# Patient Record
Sex: Male | Born: 1990 | Race: Black or African American | Hispanic: No | Marital: Married | Smoking: Never smoker
Health system: Southern US, Community
[De-identification: ages and names within clinical notes are randomized; demographics above are authoritative.]

---
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
## 2016-11-22 ENCOUNTER — Emergency Department (HOSPITAL_COMMUNITY)
Admission: EM | Admit: 2016-11-22 | Discharge: 2016-11-22 | Disposition: A | Discharge status: Home / Self Care | Payer: Self-pay | Attending: Emergency Medicine | Admitting: Emergency Medicine

## 2016-11-22 ENCOUNTER — Encounter (HOSPITAL_COMMUNITY): Payer: Self-pay | Admitting: Emergency Medicine

## 2016-11-22 DIAGNOSIS — R110 Nausea: Secondary | ICD-10-CM | POA: Insufficient documentation

## 2016-11-22 DIAGNOSIS — R6883 Chills (without fever): Secondary | ICD-10-CM | POA: Insufficient documentation

## 2016-11-22 DIAGNOSIS — R197 Diarrhea, unspecified: Secondary | ICD-10-CM | POA: Insufficient documentation

## 2016-11-22 MED ORDER — ACETAMINOPHEN 500 MG PO TABS
1000.0000 mg | ORAL_TABLET | Freq: Once | ORAL | Status: AC
  Administered 2016-11-22: 1000 mg via ORAL
  Filled 2016-11-22: qty 2

## 2016-11-22 NOTE — ED Notes (Signed)
Patient given crackers and water

## 2016-11-22 NOTE — ED Triage Notes (Signed)
Pt c/o chills and nausea since last night. Pt denies vomiting. States he has been able to take minimal po fluids. Denies pain.

## 2016-11-22 NOTE — Discharge Instructions (Signed)
Please call the number listed in the black box to obtain a primary care doctor.  If your symptoms do not improve, or you develop any new concerning symptoms please seek further evaluation.    Please stay hydrated.

## 2016-11-22 NOTE — ED Provider Notes (Signed)
[facility]-EMERGENCY DEPT Provider Note   CSN: [id number] Arrival date & time: 11/22/16  1347  By signing my name below, I, Phillips Climes, attest that this documentation has been prepared under the direction and in the presence of Cristina Gong, [place]. Electronically Signed: Phillips Climes, Scribe. 11/22/2016. 8:03 PM.  History   Chief Complaint Chief Complaint  Patient presents with  . Chills  . Nausea   HPI Comments Jay Harris is a 26 y.o. male with no reported PMHx, who presents to the Emergency Department with complaints of subjective fever and chills x14 hours.  Associated nausea and diarrhea.  No back or abdominal pain.  No sick contact.  No recent ABX use.  Normal diet.  No recent international travel.  No dyspnea.  Possible hx of seasonal allergies, for which he takes no OTC medication.  No myalgias, headache, eye irritation, ear pain, sore throat, rashes, neck pain or neck stiffness.  Pt treated his sx with Advil this morning.  He reports low PO intake, 2/2 discomfort.  No recent tick bites or rashes.  The history is provided by the patient. No language interpreter was used.    History reviewed. No pertinent past medical history.  There are no active problems to display for this patient.   History reviewed. No pertinent surgical history.     Home Medications    Prior to Admission medications   Not on File    Family History History reviewed. No pertinent family history.  Social History Social History  Substance Use Topics  . Smoking status: Never Smoker  . Smokeless tobacco: Never Used  . Alcohol use No     Allergies   Patient has no allergy information on record.   Review of Systems Review of Systems  Constitutional: Positive for chills and fever.  HENT: Negative for ear discharge, ear pain and sore throat.   Eyes: Negative for pain, discharge, redness and itching.  Respiratory: Negative for shortness of breath.   Gastrointestinal: Positive  for abdominal pain, diarrhea and nausea. Negative for vomiting.  Musculoskeletal: Negative for back pain, myalgias, neck pain and neck stiffness.  Skin: Negative for rash.  Neurological: Negative for headaches.     Physical Exam Updated Vital Signs BP 124/69   Pulse 79   Temp 98 F (36.7 C)   Resp 16   SpO2 99%   Physical Exam  Constitutional: He appears well-developed and well-nourished. No distress.  HENT:  Head: Normocephalic and atraumatic.  Nasal mucosa was edematous.  Neck: Normal range of motion. Neck supple.  Pulmonary/Chest: Effort normal. No respiratory distress.  Abdominal: Soft. Bowel sounds are normal. He exhibits no distension and no mass. There is no tenderness. There is no rebound and no guarding.  Musculoskeletal: Normal range of motion. He exhibits no edema or tenderness.  Neurological: He is alert.  Skin: Skin is warm and dry. No rash noted.  Nursing note and vitals reviewed.  ED Treatments / Results  DIAGNOSTIC STUDIES: Oxygen Saturation is 100% on room air, normal by my interpretation.    COORDINATION OF CARE: 3:32 PM Discussed treatment plan with pt at bedside and pt agreed to plan.  5:15 PM Pt passed PO challenge. He would like to be d/c home.  Strict return precautions were given and He Stated his understanding.   Labs (all labs ordered are listed, but only abnormal results are displayed) Labs Reviewed - No data to display  EKG  EKG Interpretation None  Radiology No results found.  Procedures Procedures (including critical care time)  Medications Ordered in ED Medications  acetaminophen (TYLENOL) tablet 1,000 mg (1,000 mg Oral Given 11/22/16 1655)     Initial Impression / Assessment and Plan / ED Course  I have reviewed the triage vital signs and the nursing notes.  Pertinent labs & imaging results that were available during my care of the patient were reviewed by me and considered in my medical decision making (see chart for  details).    Patient with symptoms consistent with viral gastroenteritis.  Vitals are stable, no fever.  No signs of dehydration, tolerating PO fluids > 6 oz.  Lungs are clear.  No focal abdominal pain, no concern for appendicitis, cholecystitis, pancreatitis, ruptured viscus, UTI, kidney stone, or any other abdominal etiology.  Supportive therapy indicated with return if symptoms worsen.  No history of tick bites, patient reports he does not spend much time outside.  Patient counseled on expected course and given return precautions, of which he stated his understanding.  Patient given information about how to obtain a primary care provider. Patient was given the option to ask questions, all of which were answered to the best of my ability.  I personally performed the services described in this documentation, which was scribed in my presence. The recorded information has been reviewed and is accurate.    Final Clinical Impressions(s) / ED Diagnoses   Final diagnoses:  Chills  Nausea  Diarrhea, unspecified type    New Prescriptions There are no discharge medications for this patient.    Norman ClayHammond, Elizabeth W, PA-C 11/22/16 2004    Tegeler, Canary Brimhristopher J, MD 11/22/16 2011

## 2020-02-12 ENCOUNTER — Other Ambulatory Visit: Payer: Self-pay

## 2020-02-12 ENCOUNTER — Encounter (HOSPITAL_COMMUNITY): Payer: Self-pay

## 2020-02-12 ENCOUNTER — Emergency Department (HOSPITAL_COMMUNITY): Payer: No Typology Code available for payment source

## 2020-02-12 ENCOUNTER — Emergency Department (HOSPITAL_COMMUNITY)
Admission: EM | Admit: 2020-02-12 | Discharge: 2020-02-12 | Disposition: A | Discharge status: Home / Self Care | Payer: No Typology Code available for payment source | Attending: Emergency Medicine | Admitting: Emergency Medicine

## 2020-02-12 DIAGNOSIS — Y999 Unspecified external cause status: Secondary | ICD-10-CM | POA: Insufficient documentation

## 2020-02-12 DIAGNOSIS — Y92410 Unspecified street and highway as the place of occurrence of the external cause: Secondary | ICD-10-CM | POA: Insufficient documentation

## 2020-02-12 DIAGNOSIS — Y939 Activity, unspecified: Secondary | ICD-10-CM | POA: Diagnosis not present

## 2020-02-12 DIAGNOSIS — F1590 Other stimulant use, unspecified, uncomplicated: Secondary | ICD-10-CM | POA: Insufficient documentation

## 2020-02-12 DIAGNOSIS — T1490XA Injury, unspecified, initial encounter: Secondary | ICD-10-CM

## 2020-02-12 DIAGNOSIS — M79602 Pain in left arm: Secondary | ICD-10-CM | POA: Insufficient documentation

## 2020-02-12 MED ORDER — KETOROLAC TROMETHAMINE 60 MG/2ML IM SOLN
60.0000 mg | Freq: Once | INTRAMUSCULAR | Status: AC
  Administered 2020-02-12: 60 mg via INTRAMUSCULAR
  Filled 2020-02-12: qty 2
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
## 2020-02-12 NOTE — Discharge Instructions (Signed)
Your work-up today was overall reassuring.  Please make sure to take anti-inflammatory such as ibuprofen or Tylenol for pain.  Return to the ER if your symptoms worsen.  I also have provided a referral to [facility] health community health and wellness which is a free clinic in the area.  Please follow-up with them if your symptoms do not improve.

## 2021-09-22 DIAGNOSIS — F32A Depression, unspecified: Secondary | ICD-10-CM | POA: Insufficient documentation

## 2021-09-22 DIAGNOSIS — Z560 Unemployment, unspecified: Secondary | ICD-10-CM | POA: Insufficient documentation

## 2021-09-23 ENCOUNTER — Ambulatory Visit (HOSPITAL_COMMUNITY)
Admission: EM | Admit: 2021-09-23 | Discharge: 2021-09-23 | Disposition: A | Discharge status: Home / Self Care | Payer: No Payment, Other | Attending: Psychiatry | Admitting: Psychiatry

## 2021-09-23 DIAGNOSIS — R454 Irritability and anger: Secondary | ICD-10-CM

## 2021-09-23 DIAGNOSIS — F4389 Other reactions to severe stress: Secondary | ICD-10-CM

## 2021-09-23 NOTE — ED Provider Notes (Signed)
Behavioral Health Urgent Care Medical Screening Exam ? ?Patient Name: Jay Harris ?MRN: [id number] ?Date of Evaluation: 09/23/21 ?Chief Complaint:   ?Diagnosis:  ?Final diagnoses:  ?None  ? ? ?History of Present illness: Jay Harris is a 31 y.o. male. Present to [facility] accompany by his wife,  c/o of depression and anger.  According to the patient his has been dealing with anger since age 79 yrs old and it has become increasingly frequent lately.  He get angry at every and anything.  Pt lives with wife  and has two pet dogs in the home.  Pt is currently unemployed.   ? ?Pt wife stated she just want him to get help because,  tonight the dog did a minor thing and the patient got real angry for nothing. Wife reported that patient sometime would throw stuff or punch the wall whenever he get angry.    ? ?Observation of patient,  he is alert and oriented x 4,  speech is clear but low tone.  Maintain minimal eye contact.  Affect is flat, and depressed,  mood congruent with affect.  Pt denies current SI, but stated he thought about suicide a month ago "I felt if I wasn't hear I couldn't hurt anyone".  Pt denies AVH, but report paranoia,  according to patient he feels like people are talking about him whenever he go out,  and of such he stay home mody of the time.  Pt report he is home 24/7, Pt denies childhood abuse or trauma,  denies drinking.  According to patient he does smoke marijuana every other day,  but denies using any other illicit drugs. ?Pt denies taking any prescription medication.  He does report he was seeing a therapy  at [facility] when he was around 50-18 yrs old. There is no evidence that patient is influence by internal or external stimuli. ? ?Recommend: follow up with Psychiatry walk-in clinic in the AM,  out patient therapy ? ?Psychiatric Specialty Exam ? ?Presentation  ?General Appearance:Appropriate for Environment ? ?Eye Contact:Fair ? ?Speech:Clear and Coherent ? ?Speech  Volume:Decreased ? ?Handedness:Ambidextrous ? ? ?Mood and Affect  ?Mood:Anxious; Depressed; Labile; Hopeless ? ?Affect:Flat; Constricted; Depressed ? ? ?Thought Process  ?Thought Processes:Coherent ? ?Descriptions of Associations:Circumstantial ? ?Orientation:Full (Time, Place and Person) ? ?Thought Content:Abstract Reasoning ?   Hallucinations:None ? ?Ideas of Reference:None ? ?Suicidal Thoughts:Yes, Passive ?Without Intent ? ?Homicidal Thoughts:No ? ? ?Sensorium  ?Memory:Immediate Good ? ?Judgment:Fair ? ?Insight:Fair ? ? ?Executive Functions  ?Concentration:Good ? ?Attention Span:Good ? ?Recall:Good ? ?Fund of Knowledge:Good ? ?Language:Good ? ? ?Psychomotor Activity  ?Psychomotor Activity:No data recorded ? ?Assets  ?Assets:No data recorded ? ?Sleep  ?Sleep:Fair ? ?Number of hours: No data recorded ? ?Nutritional Assessment (For OBS and FBC admissions only) ?Has the patient had a weight loss or gain of 10 pounds or more in the last 3 months?: No ?Has the patient had a decrease in food intake/or appetite?: No ?Does the patient have dental problems?: No ?Does the patient have eating habits or behaviors that may be indicators of an eating disorder including binging or inducing vomiting?: No ?Has the patient recently lost weight without trying?: 0 ? ? ? ?Physical Exam: ?Physical Exam ?HENT:  ?   Head: Normocephalic.  ?   Nose: Nose normal.  ?Cardiovascular:  ?   Rate and Rhythm: Normal rate.  ?Pulmonary:  ?   Effort: Pulmonary effort is normal.  ?Musculoskeletal:     ?   General: Normal range  of motion.  ?   Cervical back: Normal range of motion.  ?Skin: ?   General: Skin is warm.  ?Neurological:  ?   General: No focal deficit present.  ?   Mental Status: He is alert.  ?Psychiatric:     ?   Mood and Affect: Mood normal.     ?   Behavior: Behavior normal.  ? ?Review of Systems  ?Constitutional: Negative.   ?HENT: Negative.    ?Eyes: Negative.   ?Respiratory: Negative.    ?Cardiovascular: Negative.    ?Gastrointestinal: Negative.   ?Genitourinary: Negative.   ?Musculoskeletal: Negative.   ?Skin: Negative.   ?Neurological: Negative.   ?Endo/Heme/Allergies: Negative.   ?Psychiatric/Behavioral:  Positive for depression. The patient is nervous/anxious.   ?Blood pressure 109/69, pulse 64, temperature 98 ?F (36.7 ?C), temperature source Oral, resp. rate 18, SpO2 100 %. There is no height or weight on file to calculate BMI. ? ?Musculoskeletal: ?Strength & Muscle Tone: within normal limits ?Gait & Station: normal ?Patient leans: N/A ? ? ?[facility] Discharge Disposition for Follow up and Recommendations: ?Based on my evaluation the patient does not appear to have an emergency medical condition and can be discharged with resources and follow up care in outpatient services for Individual Therapy ? ? ?Sindy Guadeloupe, NP ?09/23/2021, 12:21 AM ? ?

## 2021-09-23 NOTE — Discharge Instructions (Addendum)
Follow up with Psychiatry Walk-in clinic ?Follow up with anger management (resources provided to patient) ?

## 2021-09-23 NOTE — Progress Notes (Signed)
?   09/22/21 2357  ?Patient Reported Information  ?How Did You Hear About [place]? Family/Friend  ?What Is the Reason for Your Visit/Call Today? Pt reports, he told his wife to bring him in, he hides himself from his family. Pt reports, he's had anger problems since he was little and it's getting worse. Pt reports, he used to know how to control it. Per pt, if certian things are not on schedule he becomes upset. Pt reports, having depression, self-doubt. Pt reports, he has a knife collection. Pt denies, SI, HI, AVH, self-injurious behaviors.  ?How Long Has This Been Causing You Problems? > than 6 months  ?What Do You Feel Would Help You the Most Today? Treatment for Depression or other mood problem  ?Have You Recently Had Any Thoughts About Hurting Yourself? No  ?Are You Planning to Commit Suicide/Harm Yourself At This time? No  ?Have you Recently Had Thoughts About Hurting Someone Karolee Ohs? No  ?Are You Planning To Harm Someone At This Time? No  ?Have You Used Any Alcohol or Drugs in the Past 24 Hours? Yes  ?What Did You Use and How Much? Pt reports, he smoked a dime bag today. Pt reports, he smokes often.  ?Do You Currently Have a Therapist/Psychiatrist? No  ?[facility] Screening Triage Referral Assessment  ?Type of Contact Face-to-Face  ?Location of Assessment [facility] Assessment Services  ?Provider location [facility] Assessment Services  ?Collateral Involvement Pt declined for clinician to speak to his wife to obtain collateral information.  ?Patient Determined To Be At Risk for Harm To Self or Others Based on Review of Patient Reported Information or Presenting Complaint? No  ?Does Patient Present under Involuntary Commitment? No  ?[place] of Residence [place]  ?Patient Currently Receiving the Following Services: Not Receiving Services  ?Determination of Need Routine (7 days)  ?Options For Referral Medication Management;Outpatient Therapy  ? ? ?Determination of need: Routine.  ? ? ?Redmond Pulling, MS, [facility], [facility] ?Triage  Specialist ?[phone] ? ?
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
